# Patient Record
Sex: Female | Born: 1995 | Race: White | Hispanic: No | Marital: Single | Smoking: Never smoker
Health system: Southern US, Community
[De-identification: ages and names within clinical notes are randomized; demographics above are authoritative.]

---

## 2015-12-23 ENCOUNTER — Emergency Department (HOSPITAL_COMMUNITY): Payer: Worker's Compensation

## 2015-12-23 ENCOUNTER — Encounter (HOSPITAL_COMMUNITY): Payer: Self-pay | Admitting: Emergency Medicine

## 2015-12-23 ENCOUNTER — Emergency Department (HOSPITAL_COMMUNITY)
Admission: EM | Admit: 2015-12-23 | Discharge: 2015-12-23 | Disposition: A | Discharge status: Home / Self Care | Payer: Worker's Compensation | Attending: Emergency Medicine | Admitting: Emergency Medicine

## 2015-12-23 DIAGNOSIS — X58XXXA Exposure to other specified factors, initial encounter: Secondary | ICD-10-CM | POA: Diagnosis not present

## 2015-12-23 DIAGNOSIS — S060X0A Concussion without loss of consciousness, initial encounter: Secondary | ICD-10-CM | POA: Insufficient documentation

## 2015-12-23 DIAGNOSIS — S0990XA Unspecified injury of head, initial encounter: Secondary | ICD-10-CM | POA: Diagnosis present

## 2015-12-23 DIAGNOSIS — Y999 Unspecified external cause status: Secondary | ICD-10-CM | POA: Diagnosis not present

## 2015-12-23 DIAGNOSIS — Z791 Long term (current) use of non-steroidal anti-inflammatories (NSAID): Secondary | ICD-10-CM | POA: Insufficient documentation

## 2015-12-23 DIAGNOSIS — Y929 Unspecified place or not applicable: Secondary | ICD-10-CM | POA: Insufficient documentation

## 2015-12-23 DIAGNOSIS — Y9319 Activity, other involving water and watercraft: Secondary | ICD-10-CM | POA: Insufficient documentation
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
## 2015-12-23 NOTE — Discharge Instructions (Signed)
Concussion, Adult  A concussion, or closed-head injury, is a brain injury caused by a direct blow to the head or by a quick and sudden movement (jolt) of the head or neck. Concussions are usually not life-threatening. Even so, the effects of a concussion can be serious. If you have had a concussion before, you are more likely to experience concussion-like symptoms after a direct blow to the head.   CAUSES  · Direct blow to the head, such as from running into another player during a soccer game, being hit in a fight, or hitting your head on a hard surface.  · A jolt of the head or neck that causes the brain to move back and forth inside the skull, such as in a car crash.  SIGNS AND SYMPTOMS  The signs of a concussion can be hard to notice. Early on, they may be missed by you, family members, and health care providers. You may look fine but act or feel differently.  Symptoms are usually temporary, but they may last for days, weeks, or even longer. Some symptoms may appear right away while others may not show up for hours or days. Every head injury is different. Symptoms include:  · Mild to moderate headaches that will not go away.  · A feeling of pressure inside your head.  · Having more trouble than usual:    Learning or remembering things you have heard.    Answering questions.    Paying attention or concentrating.    Organizing daily tasks.    Making decisions and solving problems.  · Slowness in thinking, acting or reacting, speaking, or reading.  · Getting lost or being easily confused.  · Feeling tired all the time or lacking energy (fatigued).  · Feeling drowsy.  · Sleep disturbances.    Sleeping more than usual.    Sleeping less than usual.    Trouble falling asleep.    Trouble sleeping (insomnia).  · Loss of balance or feeling lightheaded or dizzy.  · Nausea or vomiting.  · Numbness or tingling.  · Increased sensitivity to:    Sounds.    Lights.    Distractions.  · Vision problems or eyes that tire  easily.  · Diminished sense of taste or smell.  · Ringing in the ears.  · Mood changes such as feeling sad or anxious.  · Becoming easily irritated or angry for little or no reason.  · Lack of motivation.  · Seeing or hearing things other people do not see or hear (hallucinations).  DIAGNOSIS  Your health care provider can usually diagnose a concussion based on a description of your injury and symptoms. He or she will ask whether you passed out (lost consciousness) and whether you are having trouble remembering events that happened right before and during your injury.  Your evaluation might include:  · A brain scan to look for signs of injury to the brain. Even if the test shows no injury, you may still have a concussion.  · Blood tests to be sure other problems are not present.  TREATMENT  · Concussions are usually treated in an emergency department, in urgent care, or at a clinic. You may need to stay in the hospital overnight for further treatment.  · Tell your health care provider if you are taking any medicines, including prescription medicines, over-the-counter medicines, and natural remedies. Some medicines, such as blood thinners (anticoagulants) and aspirin, may increase the chance of complications. Also tell your health care   provider whether you have had alcohol or are taking illegal drugs. This information may affect treatment.  · Your health care provider will send you home with important instructions to follow.  · How fast you will recover from a concussion depends on many factors. These factors include how severe your concussion is, what part of your brain was injured, your age, and how healthy you were before the concussion.  · Most people with mild injuries recover fully. Recovery can take time. In general, recovery is slower in older persons. Also, persons who have had a concussion in the past or have other medical problems may find that it takes longer to recover from their current injury.  HOME  CARE INSTRUCTIONS  General Instructions  · Carefully follow the directions your health care provider gave you.  · Only take over-the-counter or prescription medicines for pain, discomfort, or fever as directed by your health care provider.  · Take only those medicines that your health care provider has approved.  · Do not drink alcohol until your health care provider says you are well enough to do so. Alcohol and certain other drugs may slow your recovery and can put you at risk of further injury.  · If it is harder than usual to remember things, write them down.  · If you are easily distracted, try to do one thing at a time. For example, do not try to watch TV while fixing dinner.  · Talk with family members or close friends when making important decisions.  · Keep all follow-up appointments. Repeated evaluation of your symptoms is recommended for your recovery.  · Watch your symptoms and tell others to do the same. Complications sometimes occur after a concussion. Older adults with a brain injury may have a higher risk of serious complications, such as a blood clot on the brain.  · Tell your teachers, school nurse, school counselor, coach, athletic trainer, or work manager about your injury, symptoms, and restrictions. Tell them about what you can or cannot do. They should watch for:    Increased problems with attention or concentration.    Increased difficulty remembering or learning new information.    Increased time needed to complete tasks or assignments.    Increased irritability or decreased ability to cope with stress.    Increased symptoms.  · Rest. Rest helps the brain to heal. Make sure you:    Get plenty of sleep at night. Avoid staying up late at night.    Keep the same bedtime hours on weekends and weekdays.    Rest during the day. Take daytime naps or rest breaks when you feel tired.  · Limit activities that require a lot of thought or concentration. These include:    Doing homework or job-related  work.    Watching TV.    Working on the computer.  · Avoid any situation where there is potential for another head injury (football, hockey, soccer, basketball, martial arts, downhill snow sports and horseback riding). Your condition will get worse every time you experience a concussion. You should avoid these activities until you are evaluated by the appropriate follow-up health care providers.  Returning To Your Regular Activities  You will need to return to your normal activities slowly, not all at once. You must give your body and brain enough time for recovery.  · Do not return to sports or other athletic activities until your health care provider tells you it is safe to do so.  · Ask   your health care provider when you can drive, ride a bicycle, or operate heavy machinery. Your ability to react may be slower after a brain injury. Never do these activities if you are dizzy.  · Ask your health care provider about when you can return to work or school.  Preventing Another Concussion  It is very important to avoid another brain injury, especially before you have recovered. In rare cases, another injury can lead to permanent brain damage, brain swelling, or death. The risk of this is greatest during the first 7-10 days after a head injury. Avoid injuries by:  · Wearing a seat belt when riding in a car.  · Drinking alcohol only in moderation.  · Wearing a helmet when biking, skiing, skateboarding, skating, or doing similar activities.  · Avoiding activities that could lead to a second concussion, such as contact or recreational sports, until your health care provider says it is okay.  · Taking safety measures in your home.    Remove clutter and tripping hazards from floors and stairways.    Use grab bars in bathrooms and handrails by stairs.    Place non-slip mats on floors and in bathtubs.    Improve lighting in dim areas.  SEEK MEDICAL CARE IF:  · You have increased problems paying attention or  concentrating.  · You have increased difficulty remembering or learning new information.  · You need more time to complete tasks or assignments than before.  · You have increased irritability or decreased ability to cope with stress.  · You have more symptoms than before.  Seek medical care if you have any of the following symptoms for more than 2 weeks after your injury:  · Lasting (chronic) headaches.  · Dizziness or balance problems.  · Nausea.  · Vision problems.  · Increased sensitivity to noise or light.  · Depression or mood swings.  · Anxiety or irritability.  · Memory problems.  · Difficulty concentrating or paying attention.  · Sleep problems.  · Feeling tired all the time.  SEEK IMMEDIATE MEDICAL CARE IF:  · You have severe or worsening headaches. These may be a sign of a blood clot in the brain.  · You have weakness (even if only in one hand, leg, or part of the face).  · You have numbness.  · You have decreased coordination.  · You vomit repeatedly.  · You have increased sleepiness.  · One pupil is larger than the other.  · You have convulsions.  · You have slurred speech.  · You have increased confusion. This may be a sign of a blood clot in the brain.  · You have increased restlessness, agitation, or irritability.  · You are unable to recognize people or places.  · You have neck pain.  · It is difficult to wake you up.  · You have unusual behavior changes.  · You lose consciousness.  MAKE SURE YOU:  · Understand these instructions.  · Will watch your condition.  · Will get help right away if you are not doing well or get worse.     This information is not intended to replace advice given to you by your health care provider. Make sure you discuss any questions you have with your health care provider.     Document Released: 09/03/2003 Document Revised: 07/04/2014 Document Reviewed: 01/03/2013  [employer] Interactive Patient Education ©2016 [employer]

## 2016-01-19 ENCOUNTER — Ambulatory Visit: Payer: Self-pay

## 2016-01-19 ENCOUNTER — Other Ambulatory Visit: Payer: Self-pay | Admitting: Occupational Medicine

## 2016-01-19 DIAGNOSIS — M25511 Pain in right shoulder: Secondary | ICD-10-CM
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
